# Patient Record
Sex: Female | Born: 1935 | Hispanic: No | Marital: Married
Health system: Southern US, Community
[De-identification: ages and names within clinical notes are randomized; demographics above are authoritative.]

---

## 2016-02-19 ENCOUNTER — Inpatient Hospital Stay
Admission: AD | Admit: 2016-02-19 | Discharge: 2016-03-30 | Disposition: A | Discharge status: Home Health | Payer: Self-pay | Source: Ambulatory Visit | Attending: Internal Medicine | Admitting: Internal Medicine

## 2016-02-19 ENCOUNTER — Other Ambulatory Visit (HOSPITAL_COMMUNITY): Payer: Self-pay

## 2016-02-19 DIAGNOSIS — Z431 Encounter for attention to gastrostomy: Secondary | ICD-10-CM

## 2016-02-19 DIAGNOSIS — Z452 Encounter for adjustment and management of vascular access device: Secondary | ICD-10-CM

## 2016-02-19 DIAGNOSIS — T829XXA Unspecified complication of cardiac and vascular prosthetic device, implant and graft, initial encounter: Secondary | ICD-10-CM

## 2016-02-19 DIAGNOSIS — Z931 Gastrostomy status: Secondary | ICD-10-CM

## 2016-02-19 MED ORDER — IOPAMIDOL (ISOVUE-300) INJECTION 61%
50.0000 mL | Freq: Once | INTRAVENOUS | Status: AC | PRN
  Administered 2016-02-19: 50 mL

## 2016-02-20 LAB — HEPARIN LEVEL (UNFRACTIONATED)
HEPARIN UNFRACTIONATED: 0.59 [IU]/mL (ref 0.30–0.70)
HEPARIN UNFRACTIONATED: 0.26 [IU]/mL — AB (ref 0.30–0.70)

## 2016-02-20 LAB — COMPREHENSIVE METABOLIC PANEL
ALT: 28 U/L (ref 14–54)
ANION GAP: 13 (ref 5–15)
AST: 27 U/L (ref 15–41)
Albumin: 2.5 g/dL — ABNORMAL LOW (ref 3.5–5.0)
Alkaline Phosphatase: 83 U/L (ref 38–126)
BUN: 13 mg/dL (ref 6–20)
CHLORIDE: 94 mmol/L — AB (ref 101–111)
CO2: 30 mmol/L (ref 22–32)
CREATININE: 0.42 mg/dL — AB (ref 0.44–1.00)
Calcium: 8.9 mg/dL (ref 8.9–10.3)
Glucose, Bld: 127 mg/dL — ABNORMAL HIGH (ref 65–99)
POTASSIUM: 3.3 mmol/L — AB (ref 3.5–5.1)
SODIUM: 137 mmol/L (ref 135–145)
Total Bilirubin: 0.8 mg/dL (ref 0.3–1.2)
Total Protein: 6.1 g/dL — ABNORMAL LOW (ref 6.5–8.1)

## 2016-02-20 LAB — CBC
HEMATOCRIT: 34.5 % — AB (ref 36.0–46.0)
HEMOGLOBIN: 11.1 g/dL — AB (ref 12.0–15.0)
MCH: 32.9 pg (ref 26.0–34.0)
MCHC: 32.2 g/dL (ref 30.0–36.0)
MCV: 102.4 fL — AB (ref 78.0–100.0)
PLATELETS: 371 10*3/uL (ref 150–400)
RBC: 3.37 MIL/uL — AB (ref 3.87–5.11)
RDW: 17.9 % — ABNORMAL HIGH (ref 11.5–15.5)
WBC: 13.8 10*3/uL — AB (ref 4.0–10.5)

## 2016-02-20 LAB — PROTIME-INR
INR: 1.16
Prothrombin Time: 14.8 seconds (ref 11.4–15.2)

## 2016-02-20 LAB — TSH: TSH: 6.124 u[IU]/mL — AB (ref 0.350–4.500)

## 2016-02-21 LAB — HEPARIN LEVEL (UNFRACTIONATED): HEPARIN UNFRACTIONATED: 0.3 [IU]/mL (ref 0.30–0.70)

## 2016-02-22 ENCOUNTER — Encounter (HOSPITAL_COMMUNITY): Payer: Self-pay | Admitting: Interventional Radiology

## 2016-02-22 ENCOUNTER — Other Ambulatory Visit (HOSPITAL_COMMUNITY): Payer: Self-pay

## 2016-02-22 HISTORY — PX: IR GENERIC HISTORICAL: IMG1180011

## 2016-02-22 LAB — BLOOD GAS, ARTERIAL
ACID-BASE EXCESS: 10 mmol/L — AB (ref 0.0–2.0)
Bicarbonate: 33.5 mmol/L — ABNORMAL HIGH (ref 20.0–28.0)
FIO2: 40
LHR: 12 {breaths}/min
O2 SAT: 99.1 %
PCO2 ART: 40.3 mmHg (ref 32.0–48.0)
PEEP/CPAP: 5 cmH2O
PH ART: 7.53 — AB (ref 7.350–7.450)
Patient temperature: 98.6
Pressure control: 18 cmH2O
pO2, Arterial: 127 mmHg — ABNORMAL HIGH (ref 83.0–108.0)

## 2016-02-22 LAB — HEPARIN LEVEL (UNFRACTIONATED): HEPARIN UNFRACTIONATED: 0.27 [IU]/mL — AB (ref 0.30–0.70)

## 2016-02-22 MED ORDER — IOPAMIDOL (ISOVUE-300) INJECTION 61%
INTRAVENOUS | Status: AC
  Administered 2016-02-22: 15 mL
  Filled 2016-02-22: qty 50

## 2016-02-23 LAB — PROTIME-INR
INR: 1.22
Prothrombin Time: 15.5 seconds — ABNORMAL HIGH (ref 11.4–15.2)

## 2016-02-23 LAB — HEPARIN LEVEL (UNFRACTIONATED): Heparin Unfractionated: 0.35 IU/mL (ref 0.30–0.70)

## 2016-02-24 LAB — COMPREHENSIVE METABOLIC PANEL
ALT: 17 U/L (ref 14–54)
ANION GAP: 13 (ref 5–15)
AST: 20 U/L (ref 15–41)
Albumin: 2.2 g/dL — ABNORMAL LOW (ref 3.5–5.0)
Alkaline Phosphatase: 56 U/L (ref 38–126)
BILIRUBIN TOTAL: 0.3 mg/dL (ref 0.3–1.2)
BUN: 20 mg/dL (ref 6–20)
CALCIUM: 8 mg/dL — AB (ref 8.9–10.3)
CO2: 28 mmol/L (ref 22–32)
Chloride: 99 mmol/L — ABNORMAL LOW (ref 101–111)
Creatinine, Ser: 0.92 mg/dL (ref 0.44–1.00)
GFR calc non Af Amer: 58 mL/min — ABNORMAL LOW (ref >60)
Glucose, Bld: 127 mg/dL — ABNORMAL HIGH (ref 65–99)
Potassium: 3.4 mmol/L — ABNORMAL LOW (ref 3.5–5.1)
Sodium: 140 mmol/L (ref 135–145)
TOTAL PROTEIN: 4.8 g/dL — AB (ref 6.5–8.1)

## 2016-02-24 LAB — HEPARIN LEVEL (UNFRACTIONATED): Heparin Unfractionated: 0.67 IU/mL (ref 0.30–0.70)

## 2016-02-24 LAB — CBC
HEMATOCRIT: 27.8 % — AB (ref 36.0–46.0)
HEMOGLOBIN: 8.5 g/dL — AB (ref 12.0–15.0)
MCH: 32.2 pg (ref 26.0–34.0)
MCHC: 30.6 g/dL (ref 30.0–36.0)
MCV: 105.3 fL — ABNORMAL HIGH (ref 78.0–100.0)
Platelets: 303 10*3/uL (ref 150–400)
RBC: 2.64 MIL/uL — ABNORMAL LOW (ref 3.87–5.11)
RDW: 18 % — AB (ref 11.5–15.5)
WBC: 8.3 10*3/uL (ref 4.0–10.5)

## 2016-02-24 LAB — PROTIME-INR
INR: 1.02
PROTHROMBIN TIME: 13.4 s (ref 11.4–15.2)

## 2016-02-25 LAB — PROTIME-INR
INR: 1.09
PROTHROMBIN TIME: 14.2 s (ref 11.4–15.2)

## 2016-02-25 LAB — HEPARIN LEVEL (UNFRACTIONATED): Heparin Unfractionated: 0.71 IU/mL — ABNORMAL HIGH (ref 0.30–0.70)

## 2016-02-26 LAB — PROTIME-INR
INR: 1.04
Prothrombin Time: 13.6 seconds (ref 11.4–15.2)

## 2016-02-26 LAB — HEPARIN LEVEL (UNFRACTIONATED): Heparin Unfractionated: 0.76 IU/mL — ABNORMAL HIGH (ref 0.30–0.70)

## 2016-02-27 ENCOUNTER — Other Ambulatory Visit (HOSPITAL_COMMUNITY): Payer: Self-pay

## 2016-02-27 LAB — PROTIME-INR
INR: 1.02
Prothrombin Time: 13.4 seconds (ref 11.4–15.2)

## 2016-02-27 LAB — HEPARIN LEVEL (UNFRACTIONATED): Heparin Unfractionated: 0.31 IU/mL (ref 0.30–0.70)

## 2016-02-28 LAB — PROTIME-INR
INR: 1.1
Prothrombin Time: 14.2 seconds (ref 11.4–15.2)

## 2016-02-28 LAB — HEPARIN LEVEL (UNFRACTIONATED): Heparin Unfractionated: 0.17 IU/mL — ABNORMAL LOW (ref 0.30–0.70)

## 2016-02-29 LAB — HEPARIN LEVEL (UNFRACTIONATED): Heparin Unfractionated: 0.14 IU/mL — ABNORMAL LOW (ref 0.30–0.70)

## 2016-02-29 LAB — PROTIME-INR
INR: 1.26
Prothrombin Time: 15.9 seconds — ABNORMAL HIGH (ref 11.4–15.2)

## 2016-03-01 LAB — PROTIME-INR
INR: 1.61
Prothrombin Time: 19.4 seconds — ABNORMAL HIGH (ref 11.4–15.2)

## 2016-03-01 LAB — HEPARIN LEVEL (UNFRACTIONATED): Heparin Unfractionated: 0.81 IU/mL — ABNORMAL HIGH (ref 0.30–0.70)

## 2016-03-02 LAB — BASIC METABOLIC PANEL
ANION GAP: 11 (ref 5–15)
BUN: 35 mg/dL — AB (ref 6–20)
CALCIUM: 8.9 mg/dL (ref 8.9–10.3)
CO2: 32 mmol/L (ref 22–32)
Chloride: 96 mmol/L — ABNORMAL LOW (ref 101–111)
Creatinine, Ser: 0.94 mg/dL (ref 0.44–1.00)
GFR calc Af Amer: >60 mL/min (ref >60)
GFR, EST NON AFRICAN AMERICAN: 56 mL/min — AB (ref >60)
GLUCOSE: 118 mg/dL — AB (ref 65–99)
Potassium: 4.1 mmol/L (ref 3.5–5.1)
Sodium: 139 mmol/L (ref 135–145)

## 2016-03-02 LAB — CBC
HEMATOCRIT: 30.4 % — AB (ref 36.0–46.0)
HEMOGLOBIN: 9.3 g/dL — AB (ref 12.0–15.0)
MCH: 31.2 pg (ref 26.0–34.0)
MCHC: 30.6 g/dL (ref 30.0–36.0)
MCV: 102 fL — ABNORMAL HIGH (ref 78.0–100.0)
Platelets: 378 10*3/uL (ref 150–400)
RBC: 2.98 MIL/uL — ABNORMAL LOW (ref 3.87–5.11)
RDW: 16.3 % — AB (ref 11.5–15.5)
WBC: 7.9 10*3/uL (ref 4.0–10.5)

## 2016-03-02 LAB — PROTIME-INR
INR: 1.7
PROTHROMBIN TIME: 20.2 s — AB (ref 11.4–15.2)

## 2016-03-02 LAB — HEPARIN LEVEL (UNFRACTIONATED): Heparin Unfractionated: 0.32 IU/mL (ref 0.30–0.70)

## 2016-03-03 LAB — HEPARIN LEVEL (UNFRACTIONATED): HEPARIN UNFRACTIONATED: 0.26 [IU]/mL — AB (ref 0.30–0.70)

## 2016-03-03 LAB — PROTIME-INR
INR: 1.77
PROTHROMBIN TIME: 20.9 s — AB (ref 11.4–15.2)

## 2016-03-04 LAB — PROTIME-INR
INR: 2.15
PROTHROMBIN TIME: 24.3 s — AB (ref 11.4–15.2)

## 2016-03-04 LAB — HEPARIN LEVEL (UNFRACTIONATED): HEPARIN UNFRACTIONATED: 0.23 [IU]/mL — AB (ref 0.30–0.70)

## 2016-03-05 LAB — VANCOMYCIN, TROUGH

## 2016-03-05 LAB — PROTIME-INR
INR: 2.46
Prothrombin Time: 27.1 seconds — ABNORMAL HIGH (ref 11.4–15.2)

## 2016-03-05 LAB — HEPARIN LEVEL (UNFRACTIONATED): Heparin Unfractionated: 0.71 IU/mL — ABNORMAL HIGH (ref 0.30–0.70)

## 2016-03-06 LAB — HEPARIN LEVEL (UNFRACTIONATED): Heparin Unfractionated: 0.49 IU/mL (ref 0.30–0.70)

## 2016-03-06 LAB — PROTIME-INR
INR: 2.94
Prothrombin Time: 31.3 seconds — ABNORMAL HIGH (ref 11.4–15.2)

## 2016-03-07 LAB — PROTIME-INR
INR: 2.31
PROTHROMBIN TIME: 25.8 s — AB (ref 11.4–15.2)

## 2016-03-07 LAB — HEPARIN LEVEL (UNFRACTIONATED): Heparin Unfractionated: 0.18 IU/mL — ABNORMAL LOW (ref 0.30–0.70)

## 2016-03-08 LAB — PROTIME-INR
INR: 1.93
Prothrombin Time: 22.4 seconds — ABNORMAL HIGH (ref 11.4–15.2)

## 2016-03-08 LAB — HEPARIN LEVEL (UNFRACTIONATED): Heparin Unfractionated: 0.13 IU/mL — ABNORMAL LOW (ref 0.30–0.70)

## 2016-03-09 LAB — BASIC METABOLIC PANEL
Anion gap: 10 (ref 5–15)
BUN: 33 mg/dL — AB (ref 6–20)
CO2: 31 mmol/L (ref 22–32)
CREATININE: 0.81 mg/dL (ref 0.44–1.00)
Calcium: 9.6 mg/dL (ref 8.9–10.3)
Chloride: 99 mmol/L — ABNORMAL LOW (ref 101–111)
GFR calc Af Amer: >60 mL/min (ref >60)
Glucose, Bld: 122 mg/dL — ABNORMAL HIGH (ref 65–99)
Potassium: 4.3 mmol/L (ref 3.5–5.1)
SODIUM: 140 mmol/L (ref 135–145)

## 2016-03-09 LAB — PROTIME-INR
INR: 1.85
Prothrombin Time: 21.6 seconds — ABNORMAL HIGH (ref 11.4–15.2)

## 2016-03-09 LAB — HEPARIN LEVEL (UNFRACTIONATED)

## 2016-03-10 LAB — PROTIME-INR
INR: 1.8
Prothrombin Time: 21.1 seconds — ABNORMAL HIGH (ref 11.4–15.2)

## 2016-03-10 LAB — HEPARIN LEVEL (UNFRACTIONATED)

## 2016-03-11 LAB — HEPARIN LEVEL (UNFRACTIONATED)

## 2016-03-11 LAB — PROTIME-INR
INR: 1.66
PROTHROMBIN TIME: 19.8 s — AB (ref 11.4–15.2)

## 2016-03-12 LAB — PROTIME-INR
INR: 1.96
Prothrombin Time: 22.6 seconds — ABNORMAL HIGH (ref 11.4–15.2)

## 2016-03-12 LAB — HEPARIN LEVEL (UNFRACTIONATED)

## 2016-03-13 LAB — HEPARIN LEVEL (UNFRACTIONATED)

## 2016-03-13 LAB — PROTIME-INR
INR: 1.99
PROTHROMBIN TIME: 22.9 s — AB (ref 11.4–15.2)

## 2016-03-14 LAB — BASIC METABOLIC PANEL
Anion gap: 11 (ref 5–15)
BUN: 31 mg/dL — ABNORMAL HIGH (ref 6–20)
CALCIUM: 9.2 mg/dL (ref 8.9–10.3)
CO2: 31 mmol/L (ref 22–32)
CREATININE: 0.94 mg/dL (ref 0.44–1.00)
Chloride: 98 mmol/L — ABNORMAL LOW (ref 101–111)
GFR, EST NON AFRICAN AMERICAN: 56 mL/min — AB (ref >60)
Glucose, Bld: 96 mg/dL (ref 65–99)
Potassium: 3.9 mmol/L (ref 3.5–5.1)
SODIUM: 140 mmol/L (ref 135–145)

## 2016-03-14 LAB — HEPARIN LEVEL (UNFRACTIONATED): Heparin Unfractionated: <0.1 IU/mL — ABNORMAL LOW (ref 0.30–0.70)

## 2016-03-14 LAB — PROTIME-INR
INR: 2.14
PROTHROMBIN TIME: 24.2 s — AB (ref 11.4–15.2)

## 2016-03-14 LAB — MAGNESIUM: MAGNESIUM: 2.3 mg/dL (ref 1.7–2.4)

## 2016-03-15 LAB — PROTIME-INR
INR: 2
Prothrombin Time: 23 seconds — ABNORMAL HIGH (ref 11.4–15.2)

## 2016-03-15 LAB — HEPARIN LEVEL (UNFRACTIONATED)

## 2016-03-16 LAB — PROTIME-INR
INR: 2.28
Prothrombin Time: 25.5 seconds — ABNORMAL HIGH (ref 11.4–15.2)

## 2016-03-16 LAB — HEPARIN LEVEL (UNFRACTIONATED): Heparin Unfractionated: <0.1 IU/mL — ABNORMAL LOW (ref 0.30–0.70)

## 2016-03-17 LAB — HEPARIN LEVEL (UNFRACTIONATED)

## 2016-03-18 ENCOUNTER — Other Ambulatory Visit (HOSPITAL_COMMUNITY): Payer: Self-pay

## 2016-03-18 ENCOUNTER — Encounter (HOSPITAL_COMMUNITY): Payer: Self-pay | Admitting: Interventional Radiology

## 2016-03-18 HISTORY — PX: IR GENERIC HISTORICAL: IMG1180011

## 2016-03-18 LAB — HEPARIN LEVEL (UNFRACTIONATED)

## 2016-03-18 LAB — PROTIME-INR
INR: 2.46
Prothrombin Time: 27.1 seconds — ABNORMAL HIGH (ref 11.4–15.2)

## 2016-03-18 MED ORDER — IOPAMIDOL (ISOVUE-300) INJECTION 61%
INTRAVENOUS | Status: AC
  Administered 2016-03-18: 20 mL
  Filled 2016-03-18: qty 50

## 2016-03-18 NOTE — Procedures (Signed)
Successful fluoroscopic guided replacement of  A new 22 Fr gastrostomy tube.  No immediate post procedural complications.  The feeding tube is ready for immediate use.  Laurie RightJay Mackenzey Crownover, MD Pager #: [phone]

## 2016-03-19 LAB — HEPARIN LEVEL (UNFRACTIONATED): Heparin Unfractionated: <0.1 IU/mL — ABNORMAL LOW (ref 0.30–0.70)

## 2016-03-20 LAB — HEPARIN LEVEL (UNFRACTIONATED)

## 2016-03-20 LAB — PROTIME-INR
INR: 3.2
PROTHROMBIN TIME: 33.5 s — AB (ref 11.4–15.2)

## 2016-03-21 LAB — PROTIME-INR
INR: 3.25
Prothrombin Time: 33.9 seconds — ABNORMAL HIGH (ref 11.4–15.2)

## 2016-03-21 LAB — HEPARIN LEVEL (UNFRACTIONATED)

## 2016-03-22 LAB — HEPARIN LEVEL (UNFRACTIONATED): Heparin Unfractionated: <0.1 IU/mL — ABNORMAL LOW (ref 0.30–0.70)

## 2016-03-22 LAB — COMPREHENSIVE METABOLIC PANEL
ALT: 26 U/L (ref 14–54)
AST: 30 U/L (ref 15–41)
Albumin: 2.7 g/dL — ABNORMAL LOW (ref 3.5–5.0)
Alkaline Phosphatase: 64 U/L (ref 38–126)
Anion gap: 12 (ref 5–15)
BILIRUBIN TOTAL: 0.4 mg/dL (ref 0.3–1.2)
BUN: 20 mg/dL (ref 6–20)
CHLORIDE: 98 mmol/L — AB (ref 101–111)
CO2: 26 mmol/L (ref 22–32)
Calcium: 9.2 mg/dL (ref 8.9–10.3)
Creatinine, Ser: 0.84 mg/dL (ref 0.44–1.00)
Glucose, Bld: 105 mg/dL — ABNORMAL HIGH (ref 65–99)
POTASSIUM: 3.9 mmol/L (ref 3.5–5.1)
Sodium: 136 mmol/L (ref 135–145)
TOTAL PROTEIN: 6.3 g/dL — AB (ref 6.5–8.1)

## 2016-03-22 LAB — CBC
HEMATOCRIT: 29.9 % — AB (ref 36.0–46.0)
Hemoglobin: 9.5 g/dL — ABNORMAL LOW (ref 12.0–15.0)
MCH: 30.9 pg (ref 26.0–34.0)
MCHC: 31.8 g/dL (ref 30.0–36.0)
MCV: 97.4 fL (ref 78.0–100.0)
PLATELETS: 460 10*3/uL — AB (ref 150–400)
RBC: 3.07 MIL/uL — ABNORMAL LOW (ref 3.87–5.11)
RDW: 15.9 % — AB (ref 11.5–15.5)
WBC: 10.5 10*3/uL (ref 4.0–10.5)

## 2016-03-22 LAB — PROTIME-INR
INR: 2.23
PROTHROMBIN TIME: 25.1 s — AB (ref 11.4–15.2)

## 2016-03-23 LAB — HEPARIN LEVEL (UNFRACTIONATED): Heparin Unfractionated: <0.1 IU/mL — ABNORMAL LOW (ref 0.30–0.70)

## 2016-03-23 LAB — PROTIME-INR
INR: 1.67
PROTHROMBIN TIME: 19.9 s — AB (ref 11.4–15.2)

## 2016-03-24 LAB — HEPARIN LEVEL (UNFRACTIONATED)

## 2016-03-24 LAB — PROTIME-INR
INR: 1.5
PROTHROMBIN TIME: 18.2 s — AB (ref 11.4–15.2)

## 2016-03-25 LAB — PROTIME-INR
INR: 1.36
Prothrombin Time: 16.9 seconds — ABNORMAL HIGH (ref 11.4–15.2)

## 2016-03-25 LAB — HEPARIN LEVEL (UNFRACTIONATED): Heparin Unfractionated: <0.1 IU/mL — ABNORMAL LOW (ref 0.30–0.70)

## 2016-03-26 LAB — HEPARIN LEVEL (UNFRACTIONATED)

## 2016-03-26 LAB — PROTIME-INR
INR: 1.33
PROTHROMBIN TIME: 16.6 s — AB (ref 11.4–15.2)

## 2016-03-27 LAB — PROTIME-INR
INR: 1.42
Prothrombin Time: 17.5 seconds — ABNORMAL HIGH (ref 11.4–15.2)

## 2016-03-27 LAB — HEPARIN LEVEL (UNFRACTIONATED): Heparin Unfractionated: 0.1 IU/mL — ABNORMAL LOW (ref 0.30–0.70)

## 2016-03-27 LAB — TSH: TSH: 47.635 u[IU]/mL — ABNORMAL HIGH (ref 0.350–4.500)

## 2016-03-28 LAB — PROTIME-INR
INR: 1.37
PROTHROMBIN TIME: 17 s — AB (ref 11.4–15.2)

## 2016-03-28 LAB — HEPARIN LEVEL (UNFRACTIONATED): Heparin Unfractionated: <0.1 IU/mL — ABNORMAL LOW (ref 0.30–0.70)

## 2016-03-29 ENCOUNTER — Other Ambulatory Visit (HOSPITAL_COMMUNITY): Payer: Self-pay

## 2016-03-29 LAB — PROTIME-INR
INR: 1.35
Prothrombin Time: 16.8 seconds — ABNORMAL HIGH (ref 11.4–15.2)

## 2016-03-30 ENCOUNTER — Other Ambulatory Visit (HOSPITAL_COMMUNITY): Payer: Self-pay

## 2016-03-30 MED ORDER — DIATRIZOATE MEGLUMINE & SODIUM 66-10 % PO SOLN
30.0000 mL | Freq: Once | ORAL | Status: DC
  Administered 2016-03-30: 30 mL via ORAL

## 2016-03-30 MED ORDER — IOPAMIDOL (ISOVUE-M 200) INJECTION 41%
20.0000 mL | Freq: Once | INTRAMUSCULAR | Status: AC
  Administered 2016-03-30: 20 mL

## 2016-11-07 DEATH — deceased

## 2018-11-10 IMAGING — CR DG CHEST 1V PORT
1 series · 1 of 1 positions shown · non-contrast
Comparison: None.

CLINICAL DATA: Acute onset of respiratory failure. Initial
encounter.

EXAM:
PORTABLE CHEST 1 VIEW

[AP]
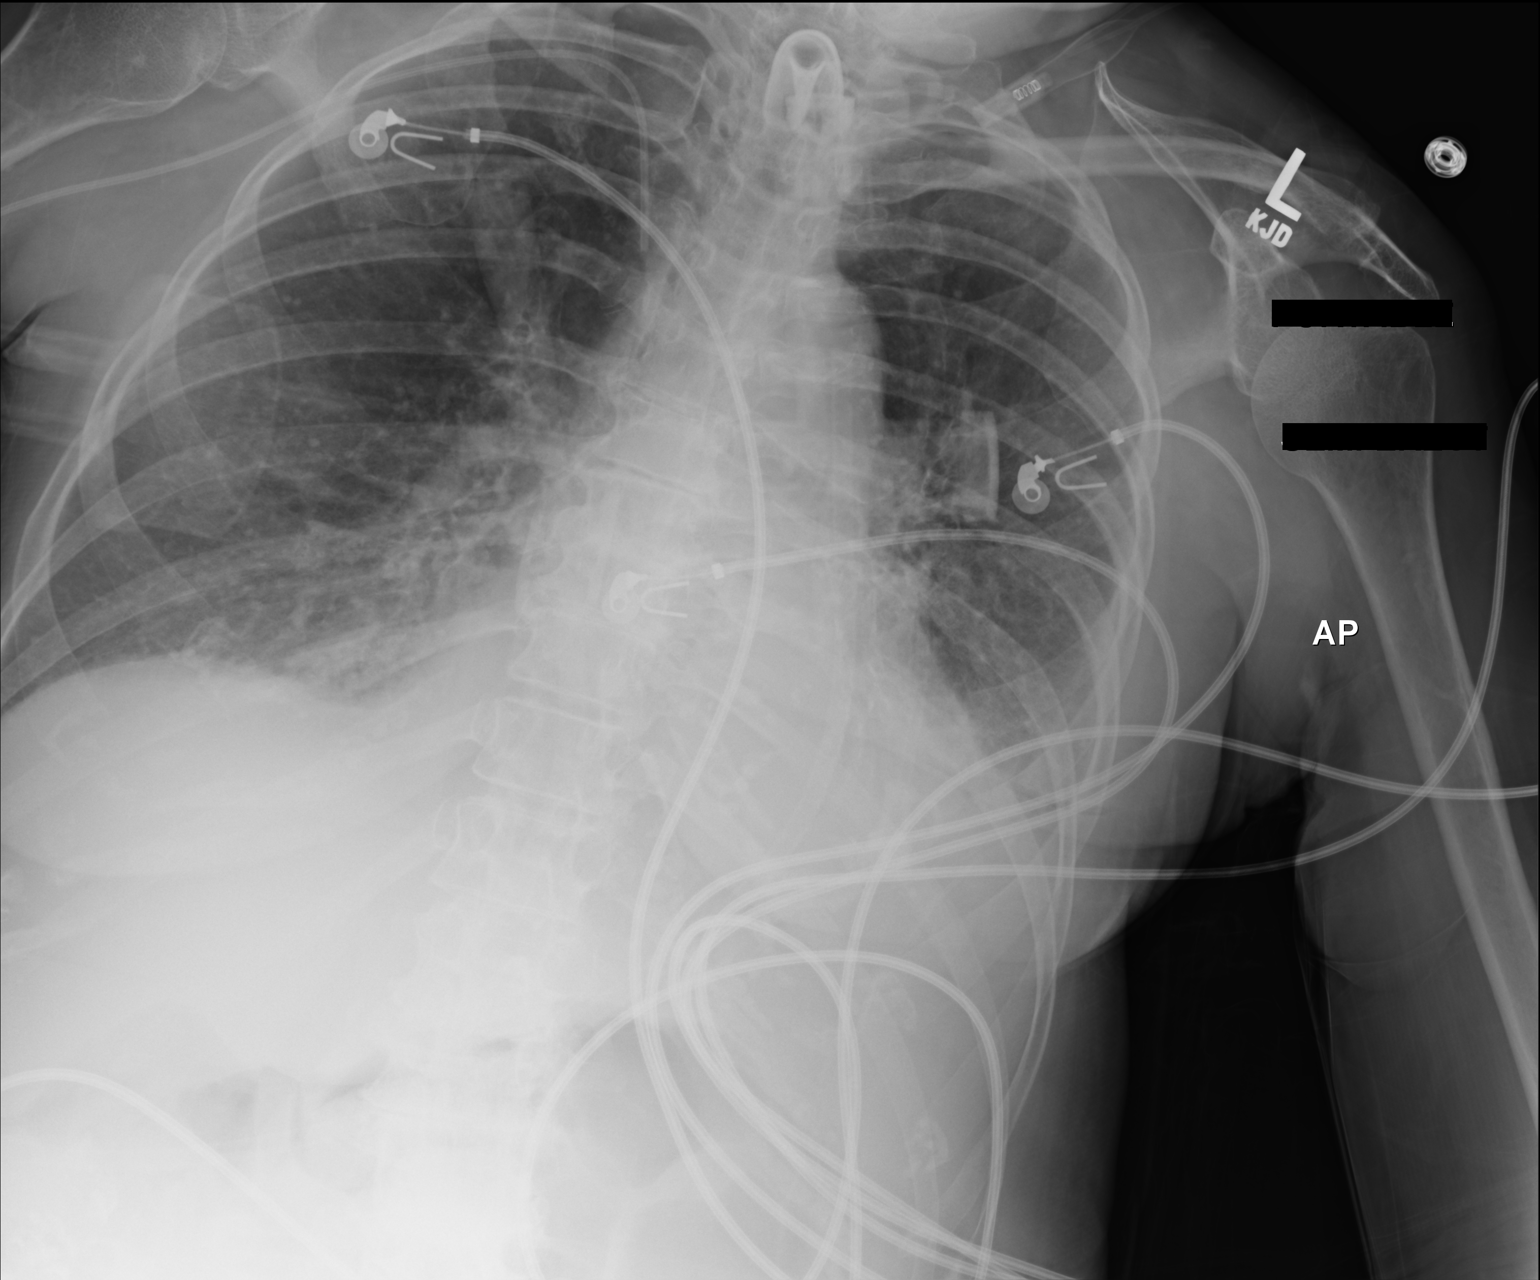

[1 of 1 positions shown; findings below may reference images not displayed]

FINDINGS: Vascular congestion is noted. Patchy bilateral perihilar and
bibasilar airspace opacities raise concern for pneumonia. A small
left pleural effusion is noted. No pneumothorax is seen.

The cardiomediastinal silhouette is mildly enlarged. The patient's
tracheostomy tube is seen ending 5-6 cm above the carina. A right
PICC is noted ending about the proximal SVC. No acute osseous
abnormalities are seen.
IMPRESSION: Vascular congestion and mild cardiomegaly. Patchy bilateral
perihilar and bibasilar airspace opacities raise concern for
pneumonia. Small left pleural effusion noted.
